# Patient Record
Sex: Male | Born: 1975 | Race: White | Hispanic: No | Marital: Married | Smoking: Never smoker
Health system: Southern US, Community
[De-identification: ages and names within clinical notes are randomized; demographics above are authoritative.]

## PROBLEM LIST (undated history)

## (undated) DIAGNOSIS — G4730 Sleep apnea, unspecified: Secondary | ICD-10-CM

## (undated) DIAGNOSIS — Z87442 Personal history of urinary calculi: Secondary | ICD-10-CM

## (undated) HISTORY — PX: WISDOM TOOTH EXTRACTION: SHX21

## (undated) HISTORY — PX: HAND TENDON SURGERY: SHX663

---

## 2019-01-04 ENCOUNTER — Encounter (HOSPITAL_BASED_OUTPATIENT_CLINIC_OR_DEPARTMENT_OTHER): Payer: Self-pay | Admitting: *Deleted

## 2019-01-04 ENCOUNTER — Other Ambulatory Visit: Payer: Self-pay | Admitting: Otolaryngology

## 2019-01-04 ENCOUNTER — Other Ambulatory Visit: Payer: Self-pay

## 2019-01-06 ENCOUNTER — Other Ambulatory Visit (HOSPITAL_COMMUNITY)
Admission: RE | Admit: 2019-01-06 | Discharge: 2019-01-06 | Disposition: A | Discharge status: Home / Self Care | Payer: Managed Care, Other (non HMO) | Source: Ambulatory Visit | Attending: Otolaryngology | Admitting: Otolaryngology

## 2019-01-06 DIAGNOSIS — Z1159 Encounter for screening for other viral diseases: Secondary | ICD-10-CM | POA: Insufficient documentation

## 2019-01-06 LAB — SARS CORONAVIRUS 2 (TAT 6-24 HRS): SARS Coronavirus 2: NEGATIVE

## 2019-01-10 ENCOUNTER — Other Ambulatory Visit: Payer: Self-pay

## 2019-01-10 ENCOUNTER — Ambulatory Visit (HOSPITAL_BASED_OUTPATIENT_CLINIC_OR_DEPARTMENT_OTHER)
Admission: RE | Admit: 2019-01-10 | Discharge: 2019-01-10 | Disposition: A | Discharge status: Home / Self Care | Payer: Managed Care, Other (non HMO) | Attending: Otolaryngology | Admitting: Otolaryngology

## 2019-01-10 ENCOUNTER — Encounter (HOSPITAL_BASED_OUTPATIENT_CLINIC_OR_DEPARTMENT_OTHER)
Admission: RE | Disposition: A | Discharge status: Home / Self Care | Payer: Self-pay | Source: Home / Self Care | Attending: Otolaryngology

## 2019-01-10 ENCOUNTER — Encounter (HOSPITAL_BASED_OUTPATIENT_CLINIC_OR_DEPARTMENT_OTHER): Payer: Self-pay

## 2019-01-10 ENCOUNTER — Ambulatory Visit (HOSPITAL_BASED_OUTPATIENT_CLINIC_OR_DEPARTMENT_OTHER): Payer: Managed Care, Other (non HMO) | Admitting: Anesthesiology

## 2019-01-10 DIAGNOSIS — G4733 Obstructive sleep apnea (adult) (pediatric): Secondary | ICD-10-CM | POA: Insufficient documentation

## 2019-01-10 HISTORY — PX: DRUG INDUCED ENDOSCOPY: SHX6808

## 2019-01-10 HISTORY — DX: Sleep apnea, unspecified: G47.30

## 2019-01-10 SURGERY — DRUG INDUCED SLEEP ENDOSCOPY
Anesthesia: Monitor Anesthesia Care | Site: Nose

## 2019-01-10 MED ORDER — LACTATED RINGERS IV SOLN
INTRAVENOUS | Status: DC
  Administered 2019-01-10: 08:00:00 via INTRAVENOUS

## 2019-01-10 MED ORDER — MIDAZOLAM HCL 2 MG/2ML IJ SOLN: 1.0000 mg | INTRAMUSCULAR | Status: DC | PRN

## 2019-01-10 MED ORDER — SCOPOLAMINE 1 MG/3DAYS TD PT72: 1.0000 | MEDICATED_PATCH | Freq: Once | TRANSDERMAL | Status: DC

## 2019-01-10 MED ORDER — PROPOFOL 500 MG/50ML IV EMUL
INTRAVENOUS | Status: DC | PRN
  Administered 2019-01-10: 75 ug/kg/min via INTRAVENOUS

## 2019-01-10 MED ORDER — FENTANYL CITRATE (PF) 100 MCG/2ML IJ SOLN: 25.0000 ug | INTRAMUSCULAR | Status: DC | PRN

## 2019-01-10 MED ORDER — MEPERIDINE HCL 25 MG/ML IJ SOLN: 6.2500 mg | INTRAMUSCULAR | Status: DC | PRN

## 2019-01-10 MED ORDER — OXYMETAZOLINE HCL 0.05 % NA SOLN
NASAL | Status: DC | PRN
  Administered 2019-01-10: 1

## 2019-01-10 MED ORDER — LIDOCAINE 2% (20 MG/ML) 5 ML SYRINGE
INTRAMUSCULAR | Status: DC | PRN
  Administered 2019-01-10: 80 mg via INTRAVENOUS

## 2019-01-10 MED ORDER — FENTANYL CITRATE (PF) 100 MCG/2ML IJ SOLN: 50.0000 ug | INTRAMUSCULAR | Status: DC | PRN

## 2019-01-10 MED ORDER — PROMETHAZINE HCL 25 MG/ML IJ SOLN: 6.2500 mg | INTRAMUSCULAR | Status: DC | PRN

## 2019-01-10 MED ORDER — LIDOCAINE 2% (20 MG/ML) 5 ML SYRINGE
INTRAMUSCULAR | Status: AC
  Filled 2019-01-10: qty 5

## 2019-01-10 SURGICAL SUPPLY — 12 items
CANISTER SUCT 1200ML W/VALVE (MISCELLANEOUS) ×2 IMPLANT
COVER WAND RF STERILE (DRAPES) IMPLANT
GLOVE BIO SURGEON STRL SZ7.5 (GLOVE) ×2 IMPLANT
NDL PRECISIONGLIDE 27X1.5 (NEEDLE) IMPLANT
NEEDLE PRECISIONGLIDE 27X1.5 (NEEDLE) IMPLANT
PACK BASIN DAY SURGERY FS (CUSTOM PROCEDURE TRAY) ×2 IMPLANT
PATTIES SURGICAL .5 X3 (DISPOSABLE) ×2 IMPLANT
SHEET MEDIUM DRAPE 40X70 STRL (DRAPES) ×1 IMPLANT
SOLUTION ANTI FOG 6CC (MISCELLANEOUS) ×2 IMPLANT
SYR CONTROL 10ML LL (SYRINGE) IMPLANT
TOWEL GREEN STERILE FF (TOWEL DISPOSABLE) ×2 IMPLANT
TUBE CONNECTING 20X1/4 (TUBING) ×2 IMPLANT

## 2019-01-10 NOTE — H&P (Signed)
Eddie Edwards is an 43 y.o. male.   Chief Complaint: Sleep apnea HPI: 43 year old male with obstructive sleep apnea unable to tolerate CPAP.  He presents for sleep endoscopy.  Past Medical History:  Diagnosis Date  . Sleep apnea     Past Surgical History:  Procedure Laterality Date  . HAND TENDON SURGERY      History reviewed. No pertinent family history. Social History:  reports that he has never smoked. He has never used smokeless tobacco. He reports current alcohol use. He reports that he does not use drugs.  Allergies: No Known Allergies  Medications Prior to Admission  Medication Sig Dispense Refill  . Multiple Vitamins-Minerals (MULTIVITAMIN ADULTS PO) Take by mouth.      No results found for this or any previous visit (from the past 48 hour(s)). No results found.  Review of Systems  All other systems reviewed and are negative.   Blood pressure 115/69, temperature 98.1 F (36.7 C), temperature source Oral, resp. rate 18, height 5' 11"  (1.803 m), weight 84.7 kg, SpO2 100 %. Physical Exam  Constitutional: He is oriented to person, place, and time. He appears well-developed and well-nourished. No distress.  HENT:  Head: Normocephalic and atraumatic.  Right Ear: External ear normal.  Left Ear: External ear normal.  Nose: Nose normal.  Mouth/Throat: Oropharynx is clear and moist.  Eyes: Pupils are equal, round, and reactive to light. Conjunctivae and EOM are normal.  Neck: Normal range of motion. Neck supple.  Cardiovascular: Normal rate.  Respiratory: Effort normal.  Neurological: He is alert and oriented to person, place, and time. No cranial nerve deficit.  Skin: Skin is warm and dry.  Psychiatric: He has a normal mood and affect. His behavior is normal. Judgment and thought content normal.     Assessment/Plan Obstructive sleep apnea  To OR for DISE.  Melida Quitter, MD 01/10/2019, 8:31 AM

## 2019-01-10 NOTE — Transfer of Care (Signed)
Immediate Anesthesia Transfer of Care Note  Patient: Caedon Bond Kaigler  Procedure(s) Performed: DRUG INDUCED ENDOSCOPY (N/A Nose)  Patient Location: PACU  Anesthesia Type:MAC  Level of Consciousness: awake, alert  and oriented  Airway & Oxygen Therapy: Patient Spontanous Breathing and Patient connected to nasal cannula oxygen  Post-op Assessment: Report given to RN and Post -op Vital signs reviewed and stable  Post vital signs: Reviewed and stable  Last Vitals:  Vitals Value Taken Time  BP    Temp    Pulse 56   Resp 14   SpO2 100%     Last Pain:  Vitals:   01/10/19 0815  TempSrc: Oral  PainSc: 0-No pain      Patients Stated Pain Goal: 4 (36/68/15 9470)  Complications: No apparent anesthesia complications

## 2019-01-10 NOTE — Anesthesia Preprocedure Evaluation (Addendum)
Anesthesia Evaluation  Patient identified by MRN, date of birth, ID band Patient awake    Reviewed: Allergy & Precautions, NPO status , Patient's Chart, lab work & pertinent test results  Airway Mallampati: II  TM Distance: >3 FB Neck ROM: Full    Dental  (+) Dental Advisory Given   Pulmonary neg pulmonary ROS,    Pulmonary exam normal breath sounds clear to auscultation       Cardiovascular negative cardio ROS Normal cardiovascular exam Rhythm:Regular Rate:Normal     Neuro/Psych negative neurological ROS  negative psych ROS   GI/Hepatic negative GI ROS, Neg liver ROS,   Endo/Other  negative endocrine ROS  Renal/GU negative Renal ROS     Musculoskeletal negative musculoskeletal ROS (+)   Abdominal   Peds  Hematology negative hematology ROS (+)   Anesthesia Other Findings   Reproductive/Obstetrics                             Anesthesia Physical Anesthesia Plan  ASA: II  Anesthesia Plan: MAC   Post-op Pain Management:    Induction: Intravenous  PONV Risk Score and Plan: 2 and Ondansetron, Dexamethasone and Treatment may vary due to age or medical condition  Airway Management Planned: Simple Face Mask and Natural Airway  Additional Equipment:   Intra-op Plan:   Post-operative Plan:   Informed Consent: I have reviewed the patients History and Physical, chart, labs and discussed the procedure including the risks, benefits and alternatives for the proposed anesthesia with the patient or authorized representative who has indicated his/her understanding and acceptance.     Dental advisory given  Plan Discussed with: CRNA  Anesthesia Plan Comments:        Anesthesia Quick Evaluation

## 2019-01-10 NOTE — Op Note (Signed)
NAME: Eddie Edwards, Eddie Edwards MEDICAL RECORD [id number] ACCOUNT [id number] DATE OF BIRTH:03/31/1976 FACILITY: [facility] LOCATION: [facility] PHYSICIAN:Munira Polson Guido Sander, MD  OPERATIVE REPORT  DATE OF PROCEDURE:  01/10/2019  PREOPERATIVE DIAGNOSIS:  Obstructive sleep apnea.  POSTOPERATIVE DIAGNOSIS:  Obstructive sleep apnea.  PROCEDURE:  Drug-induced sleep endoscopy.  SURGEON:  Melida Quitter, MD  ANESTHESIA:  IV sedation.  COMPLICATIONS:  None.  INDICATIONS:  The patient is a 43 year old male with obstructive sleep apnea diagnosed in 2018.  At that time, his AHI was 40.  He has lost weight since then, but continues to have symptoms of obstructive sleep apnea.  He has been using CPAP, but only  able to use it 4-5 hours per night due to the device tethering and disrupting sleep.  He presents to the operating room for drug drug-induced sleep endoscopy.  FINDINGS:  At the velum pharynx, his airway collapsed the anterior-posterior about 75% with some mild sidewall collapse.  This makes a good candidate for Inspire.  Further evaluation of his airway demonstrated no other obstructing lesions.  His tongue  base was in a moderately posterior position during sleep.  DESCRIPTION OF PROCEDURE:  The patient was identified in the holding room, informed consent having been obtained including discussion of risks, benefits and alternatives.  The patient was brought to the operative suite and put the operative table in  supine position.  Anesthesia was induced and the patient was given IV sedation gradually until he achieved a simulated sleep.  An Afrin pledget was placed in the right side of the nose for a couple minutes and then removed.  The flexible scope was then  passed through the right nasal passage to view the nasopharynx, oropharynx, and hypopharynx.  Please see the findings.  The exam was recorded.  After this was completed, the scope was removed and the patient was returned to Anesthesia for wakeup.     He was moved to recovery room in stable condition.  AN/NUANCE  D:01/10/2019 T:01/10/2019 JOB:[id number]

## 2019-01-10 NOTE — Brief Op Note (Signed)
01/10/2019  9:26 AM  PATIENT:  Eddie Edwards  43 y.o. male  PRE-OPERATIVE DIAGNOSIS:  obstructive sleep apnea  POST-OPERATIVE DIAGNOSIS:  obstructive sleep apnea  PROCEDURE:  Procedure(s): DRUG INDUCED ENDOSCOPY (N/A)  SURGEON:  Surgeon(s) and Role:    Melida Quitter, MD - Primary  PHYSICIAN ASSISTANT:   ASSISTANTS: none   ANESTHESIA:   IV sedation  EBL: None  BLOOD ADMINISTERED:none  DRAINS: none   LOCAL MEDICATIONS USED:  NONE  SPECIMEN:  No Specimen  DISPOSITION OF SPECIMEN:  N/A  COUNTS:  YES  TOURNIQUET:  * No tourniquets in log *  DICTATION: .Other Dictation: Dictation Number [id number]  PLAN OF CARE: Discharge to home after PACU  PATIENT DISPOSITION:  PACU - hemodynamically stable.   Delay start of Pharmacological VTE agent (>24hrs) due to surgical blood loss or risk of bleeding: no

## 2019-01-10 NOTE — Anesthesia Postprocedure Evaluation (Signed)
Anesthesia Post Note  Patient: Eddie Edwards  Procedure(s) Performed: DRUG INDUCED ENDOSCOPY (N/A Nose)     Patient location during evaluation: PACU Anesthesia Type: MAC Level of consciousness: awake and alert Pain management: pain level controlled Vital Signs Assessment: post-procedure vital signs reviewed and stable Respiratory status: spontaneous breathing Cardiovascular status: stable Anesthetic complications: no    Last Vitals:  Vitals:   01/10/19 0945 01/10/19 1000  BP: 120/80 120/78  Pulse: (!) 44 (!) 46  Resp: 15 18  Temp:  36.7 C  SpO2: 99% 100%    Last Pain:  Vitals:   01/10/19 1000  TempSrc:   PainSc: 0-No pain                 Nolon Nations

## 2019-01-11 ENCOUNTER — Encounter (HOSPITAL_BASED_OUTPATIENT_CLINIC_OR_DEPARTMENT_OTHER): Payer: Self-pay | Admitting: Otolaryngology

## 2019-03-02 ENCOUNTER — Other Ambulatory Visit: Payer: Self-pay | Admitting: Otolaryngology

## 2019-03-03 ENCOUNTER — Other Ambulatory Visit: Payer: Self-pay

## 2019-03-03 ENCOUNTER — Other Ambulatory Visit: Payer: Self-pay | Admitting: Internal Medicine

## 2019-03-03 ENCOUNTER — Other Ambulatory Visit (HOSPITAL_COMMUNITY)
Admission: RE | Admit: 2019-03-03 | Discharge: 2019-03-03 | Disposition: A | Discharge status: Home / Self Care | Payer: Managed Care, Other (non HMO) | Source: Ambulatory Visit | Attending: Otolaryngology | Admitting: Otolaryngology

## 2019-03-03 DIAGNOSIS — Z01812 Encounter for preprocedural laboratory examination: Secondary | ICD-10-CM | POA: Insufficient documentation

## 2019-03-03 DIAGNOSIS — Z20828 Contact with and (suspected) exposure to other viral communicable diseases: Secondary | ICD-10-CM | POA: Insufficient documentation

## 2019-03-03 LAB — SARS CORONAVIRUS 2 BY RT PCR (HOSPITAL ORDER, PERFORMED IN ~~LOC~~ HOSPITAL LAB): SARS Coronavirus 2: NEGATIVE

## 2019-03-04 ENCOUNTER — Other Ambulatory Visit: Payer: Self-pay

## 2019-03-04 ENCOUNTER — Encounter (HOSPITAL_COMMUNITY): Payer: Self-pay | Admitting: *Deleted

## 2019-03-04 ENCOUNTER — Other Ambulatory Visit (HOSPITAL_COMMUNITY): Admission: RE | Admit: 2019-03-04 | Payer: Managed Care, Other (non HMO) | Source: Ambulatory Visit

## 2019-03-04 NOTE — Progress Notes (Signed)
Spoke with pt for pre-op call. Pt denies cardiac history, HTN or Diabetes.   Pt had Covid test done yesterday and it is negative. Pt states he's been quarantine since test was done.   Coronavirus Screening  Have you experienced the following symptoms:  Cough NO Fever (>100.67F)  NO Runny nose NO Sore throat NO Difficulty breathing/shortness of breath  NO  Have you or a family member traveled in the last 14 days and where?   Patient reminded that hospital visitation restrictions are in effect and the importance of the restrictions. Informed pt that he may have one visitor sit in the waiting area while he is in pre-op, surgery and PACU. Pt voiced understanding.

## 2019-03-07 ENCOUNTER — Ambulatory Visit (HOSPITAL_COMMUNITY): Payer: Managed Care, Other (non HMO) | Admitting: Anesthesiology

## 2019-03-07 ENCOUNTER — Other Ambulatory Visit: Payer: Self-pay

## 2019-03-07 ENCOUNTER — Ambulatory Visit (HOSPITAL_COMMUNITY): Payer: Managed Care, Other (non HMO)

## 2019-03-07 ENCOUNTER — Encounter (HOSPITAL_COMMUNITY)
Admission: RE | Disposition: A | Discharge status: Home / Self Care | Payer: Self-pay | Source: Intra-hospital | Attending: Otolaryngology

## 2019-03-07 ENCOUNTER — Encounter (HOSPITAL_COMMUNITY): Payer: Self-pay

## 2019-03-07 ENCOUNTER — Ambulatory Visit (HOSPITAL_COMMUNITY)
Admission: RE | Admit: 2019-03-07 | Discharge: 2019-03-07 | Disposition: A | Discharge status: Home / Self Care | Payer: Managed Care, Other (non HMO) | Source: Intra-hospital | Attending: Otolaryngology | Admitting: Otolaryngology

## 2019-03-07 DIAGNOSIS — G4733 Obstructive sleep apnea (adult) (pediatric): Secondary | ICD-10-CM | POA: Diagnosis not present

## 2019-03-07 HISTORY — DX: Personal history of urinary calculi: Z87.442

## 2019-03-07 HISTORY — PX: IMPLANTATION OF HYPOGLOSSAL NERVE STIMULATOR: SHX6827

## 2019-03-07 LAB — HEMOGLOBIN: Hemoglobin: 15.3 g/dL (ref 13.0–17.0)

## 2019-03-07 SURGERY — INSERTION, HYPOGLOSSAL NERVE STIMULATOR
Anesthesia: General | Site: Chest

## 2019-03-07 MED ORDER — 0.9 % SODIUM CHLORIDE (POUR BTL) OPTIME
TOPICAL | Status: DC | PRN
  Administered 2019-03-07: 1000 mL

## 2019-03-07 MED ORDER — LIDOCAINE 2% (20 MG/ML) 5 ML SYRINGE
INTRAMUSCULAR | Status: DC | PRN
  Administered 2019-03-07: 60 mg via INTRAVENOUS

## 2019-03-07 MED ORDER — PROPOFOL 10 MG/ML IV BOLUS
INTRAVENOUS | Status: AC
  Filled 2019-03-07: qty 20

## 2019-03-07 MED ORDER — HYDROCODONE-ACETAMINOPHEN 5-325 MG PO TABS
ORAL_TABLET | ORAL | Status: AC
  Filled 2019-03-07: qty 1

## 2019-03-07 MED ORDER — ONDANSETRON HCL 4 MG/2ML IJ SOLN: 4.0000 mg | INTRAMUSCULAR | Status: DC | PRN

## 2019-03-07 MED ORDER — PROPOFOL 1000 MG/100ML IV EMUL
INTRAVENOUS | Status: AC
  Filled 2019-03-07: qty 100

## 2019-03-07 MED ORDER — STERILE WATER FOR IRRIGATION IR SOLN
Status: DC | PRN
  Administered 2019-03-07: 1000 mL

## 2019-03-07 MED ORDER — FENTANYL CITRATE (PF) 250 MCG/5ML IJ SOLN
INTRAMUSCULAR | Status: AC
  Filled 2019-03-07: qty 5

## 2019-03-07 MED ORDER — ACETAMINOPHEN 500 MG PO TABS
1000.0000 mg | ORAL_TABLET | Freq: Once | ORAL | Status: AC
  Administered 2019-03-07: 09:00:00 1000 mg via ORAL

## 2019-03-07 MED ORDER — KCL IN DEXTROSE-NACL 20-5-0.45 MEQ/L-%-% IV SOLN
INTRAVENOUS | Status: DC
  Administered 2019-03-07: 15:00:00 via INTRAVENOUS
  Filled 2019-03-07: qty 1000

## 2019-03-07 MED ORDER — PROPOFOL 500 MG/50ML IV EMUL
INTRAVENOUS | Status: DC | PRN
  Administered 2019-03-07: 40 ug/kg/min via INTRAVENOUS

## 2019-03-07 MED ORDER — MIDAZOLAM HCL 2 MG/2ML IJ SOLN
INTRAMUSCULAR | Status: AC
  Filled 2019-03-07: qty 2

## 2019-03-07 MED ORDER — ONDANSETRON HCL 4 MG/2ML IJ SOLN
INTRAMUSCULAR | Status: DC | PRN
  Administered 2019-03-07: 4 mg via INTRAVENOUS

## 2019-03-07 MED ORDER — SODIUM CHLORIDE 0.9 % IV SOLN
INTRAVENOUS | Status: DC | PRN
  Administered 2019-03-07: 500 mL

## 2019-03-07 MED ORDER — HYDROCODONE-ACETAMINOPHEN 5-325 MG PO TABS
1.0000 | ORAL_TABLET | ORAL | Status: DC | PRN
  Administered 2019-03-07 (×2): 2 via ORAL
  Filled 2019-03-07: qty 2

## 2019-03-07 MED ORDER — LIDOCAINE-EPINEPHRINE 1 %-1:100000 IJ SOLN
INTRAMUSCULAR | Status: AC
  Filled 2019-03-07: qty 1

## 2019-03-07 MED ORDER — FENTANYL CITRATE (PF) 250 MCG/5ML IJ SOLN
INTRAMUSCULAR | Status: DC | PRN
  Administered 2019-03-07 (×5): 50 ug via INTRAVENOUS

## 2019-03-07 MED ORDER — MORPHINE SULFATE (PF) 2 MG/ML IV SOLN: 2.0000 mg | INTRAVENOUS | Status: DC | PRN

## 2019-03-07 MED ORDER — ONDANSETRON HCL 4 MG PO TABS: 4.0000 mg | ORAL_TABLET | ORAL | Status: DC | PRN

## 2019-03-07 MED ORDER — DEXAMETHASONE SODIUM PHOSPHATE 10 MG/ML IJ SOLN
INTRAMUSCULAR | Status: DC | PRN
  Administered 2019-03-07: 10 mg via INTRAVENOUS

## 2019-03-07 MED ORDER — PROPOFOL 10 MG/ML IV BOLUS
INTRAVENOUS | Status: DC | PRN
  Administered 2019-03-07: 200 mg via INTRAVENOUS

## 2019-03-07 MED ORDER — LIDOCAINE-EPINEPHRINE 1 %-1:100000 IJ SOLN
INTRAMUSCULAR | Status: DC | PRN
  Administered 2019-03-07: 8.5 mL

## 2019-03-07 MED ORDER — SUCCINYLCHOLINE CHLORIDE 200 MG/10ML IV SOSY
PREFILLED_SYRINGE | INTRAVENOUS | Status: DC | PRN
  Administered 2019-03-07: 120 mg via INTRAVENOUS

## 2019-03-07 MED ORDER — MIDAZOLAM HCL 5 MG/5ML IJ SOLN
INTRAMUSCULAR | Status: DC | PRN
  Administered 2019-03-07: 2 mg via INTRAVENOUS

## 2019-03-07 MED ORDER — CEFAZOLIN SODIUM-DEXTROSE 2-4 GM/100ML-% IV SOLN
2.0000 g | INTRAVENOUS | Status: AC
  Administered 2019-03-07: 09:00:00 2 g via INTRAVENOUS
  Filled 2019-03-07: qty 100

## 2019-03-07 MED ORDER — SODIUM CHLORIDE 0.9 % IV SOLN
INTRAVENOUS | Status: AC
  Filled 2019-03-07: qty 500000

## 2019-03-07 MED ORDER — ACETAMINOPHEN 500 MG PO TABS
ORAL_TABLET | ORAL | Status: AC
  Filled 2019-03-07: qty 2

## 2019-03-07 MED ORDER — LACTATED RINGERS IV SOLN
INTRAVENOUS | Status: DC
  Administered 2019-03-07 (×2): via INTRAVENOUS

## 2019-03-07 SURGICAL SUPPLY — 68 items
BAG DECANTER FOR FLEXI CONT (MISCELLANEOUS) ×2 IMPLANT
BLADE CLIPPER SURG (BLADE) ×1 IMPLANT
BLADE SURG 15 STRL LF DISP TIS (BLADE) ×3 IMPLANT
BLADE SURG 15 STRL SS (BLADE) ×2
CANISTER SUCT 3000ML PPV (MISCELLANEOUS) ×2 IMPLANT
CORD BIPOLAR FORCEPS 12FT (ELECTRODE) ×2 IMPLANT
COVER PROBE W GEL 5X96 (DRAPES) ×2 IMPLANT
COVER SURGICAL LIGHT HANDLE (MISCELLANEOUS) ×2 IMPLANT
COVER WAND RF STERILE (DRAPES) ×2 IMPLANT
DERMABOND ADVANCED (GAUZE/BANDAGES/DRESSINGS) ×3
DERMABOND ADVANCED .7 DNX12 (GAUZE/BANDAGES/DRESSINGS) ×2 IMPLANT
DRAPE C-ARM 35X43 STRL (DRAPES) ×1 IMPLANT
DRAPE HEAD BAR (DRAPES) ×2 IMPLANT
DRAPE INCISE IOBAN 66X45 STRL (DRAPES) ×2 IMPLANT
DRAPE MICROSCOPE LEICA 54X105 (DRAPE) ×1 IMPLANT
DRAPE UTILITY XL STRL (DRAPES) ×1 IMPLANT
DRSG TEGADERM 2-3/8X2-3/4 SM (GAUZE/BANDAGES/DRESSINGS) ×5 IMPLANT
ELECT COATED BLADE 2.86 ST (ELECTRODE) ×2 IMPLANT
ELECT EMG 18 NIMS (NEUROSURGERY SUPPLIES) ×2
ELECT REM PT RETURN 9FT ADLT (ELECTROSURGICAL) ×2
ELECTRODE EMG 18 NIMS (NEUROSURGERY SUPPLIES) ×1 IMPLANT
ELECTRODE REM PT RTRN 9FT ADLT (ELECTROSURGICAL) ×1 IMPLANT
FORCEPS BIPOLAR SPETZLER 8 1.0 (NEUROSURGERY SUPPLIES) ×2 IMPLANT
GAUZE 4X4 16PLY RFD (DISPOSABLE) ×2 IMPLANT
GAUZE SPONGE 4X4 12PLY STRL (GAUZE/BANDAGES/DRESSINGS) ×4 IMPLANT
GENERATOR PULSE INSPIRE (Generator) ×2 IMPLANT
GENERATOR PULSE INSPIRE IV (Generator) ×1 IMPLANT
GLOVE BIO SURGEON STRL SZ 6.5 (GLOVE) IMPLANT
GLOVE BIO SURGEON STRL SZ7.5 (GLOVE) ×2 IMPLANT
GOWN STRL REUS W/ TWL LRG LVL3 (GOWN DISPOSABLE) ×3 IMPLANT
GOWN STRL REUS W/TWL LRG LVL3 (GOWN DISPOSABLE) ×3
IV CATH 18GX1.25 SAFE RETR GRN (IV SOLUTION) ×1 IMPLANT
KIT BASIN OR (CUSTOM PROCEDURE TRAY) ×2 IMPLANT
KIT NEUROSTIMULATOR ACCESSORY (KITS) IMPLANT
KIT TURNOVER KIT B (KITS) ×2 IMPLANT
LEAD SENSING RESP INSPIRE (Lead) ×2 IMPLANT
LEAD SENSING RESP INSPIRE IV (Lead) ×1 IMPLANT
LEAD SLEEP STIM INSPIRE IV/V (Lead) ×1 IMPLANT
LEAD SLEEP STIMULATION INSPIRE (Lead) ×2 IMPLANT
LOOP VESSEL MAXI BLUE (MISCELLANEOUS) ×2 IMPLANT
LOOP VESSEL MINI RED (MISCELLANEOUS) ×1 IMPLANT
MARKER SKIN DUAL TIP RULER LAB (MISCELLANEOUS) ×4 IMPLANT
NDL HYPO 25GX1X1/2 BEV (NEEDLE) ×1 IMPLANT
NEEDLE HYPO 25GX1X1/2 BEV (NEEDLE) ×2 IMPLANT
NS IRRIG 1000ML POUR BTL (IV SOLUTION) ×2 IMPLANT
PAD ARMBOARD 7.5X6 YLW CONV (MISCELLANEOUS) ×2 IMPLANT
PASSER CATH 38CM DISP (INSTRUMENTS) ×2 IMPLANT
PENCIL BUTTON HOLSTER BLD 10FT (ELECTRODE) ×2 IMPLANT
POSITIONER HEAD DONUT 9IN (MISCELLANEOUS) ×2 IMPLANT
PROBE NERVE STIMULATOR (NEUROSURGERY SUPPLIES) ×2 IMPLANT
REMOTE CONTROL SLEEP INSPIRE (MISCELLANEOUS) ×2 IMPLANT
SET WALTER ACTIVATION W/DRAPE (SET/KITS/TRAYS/PACK) ×1 IMPLANT
SLING ARM FOAM STRAP LRG (SOFTGOODS) ×1 IMPLANT
SPONGE INTESTINAL PEANUT (DISPOSABLE) ×1 IMPLANT
STAPLER VISISTAT 35W (STAPLE) ×2 IMPLANT
SUT SILK 2 0 SH (SUTURE) ×2 IMPLANT
SUT SILK 3 0 REEL (SUTURE) ×2 IMPLANT
SUT SILK 3 0 SH 30 (SUTURE) ×5 IMPLANT
SUT SILK 3-0 (SUTURE) ×1
SUT SILK 3-0 RB1 30XBRD (SUTURE) ×1
SUT VIC AB 3-0 SH 27 (SUTURE) ×3
SUT VIC AB 3-0 SH 27X BRD (SUTURE) ×1 IMPLANT
SUT VIC AB 4-0 PS2 27 (SUTURE) ×6 IMPLANT
SUTURE SILK 3-0 RB1 30XBRD (SUTURE) ×1 IMPLANT
SYR 10ML LL (SYRINGE) ×2 IMPLANT
TAPE CLOTH SURG 4X10 WHT LF (GAUZE/BANDAGES/DRESSINGS) ×4 IMPLANT
TOWEL GREEN STERILE (TOWEL DISPOSABLE) ×2 IMPLANT
TRAY ENT MC OR (CUSTOM PROCEDURE TRAY) ×2 IMPLANT

## 2019-03-07 NOTE — Brief Op Note (Signed)
03/07/2019  11:40 AM  PATIENT:  Eddie Edwards  43 y.o. male  PRE-OPERATIVE DIAGNOSIS:  obstructive sleep apnea  POST-OPERATIVE DIAGNOSIS:  obstructive sleep apnea  PROCEDURE:  Procedure(s) with comments: IMPLANTATION OF HYPOGLOSSAL NERVE STIMULATOR (N/A) - right neck, right anterior upper chest, right lower lateral chest/upperabdomen  SURGEON:  Surgeon(s) and Role:    Melida Quitter, MD - Primary  PHYSICIAN ASSISTANT:   ASSISTANTS: none   ANESTHESIA:   general  EBL:  50 mL   BLOOD ADMINISTERED:none  DRAINS: none   LOCAL MEDICATIONS USED:  LIDOCAINE   SPECIMEN:  No Specimen  DISPOSITION OF SPECIMEN:  N/A  COUNTS:  YES  TOURNIQUET:  * No tourniquets in log *  DICTATION: .Other Dictation: Dictation Number [id number]  PLAN OF CARE: Admit for overnight observation  PATIENT DISPOSITION:  PACU - hemodynamically stable.   Delay start of Pharmacological VTE agent (>24hrs) due to surgical blood loss or risk of bleeding: no

## 2019-03-07 NOTE — Transfer of Care (Signed)
Immediate Anesthesia Transfer of Care Note  Patient: Eddie Edwards  Procedure(s) Performed: IMPLANTATION OF HYPOGLOSSAL NERVE STIMULATOR (N/A Chest)  Patient Location: PACU  Anesthesia Type:General  Level of Consciousness: awake, alert  and oriented  Airway & Oxygen Therapy: Patient Spontanous Breathing and Patient connected to nasal cannula oxygen  Post-op Assessment: Report given to RN and Post -op Vital signs reviewed and stable  Post vital signs: Reviewed and stable  Last Vitals:  Vitals Value Taken Time  BP 130/74 03/07/19 1158  Temp    Pulse 57 03/07/19 1201  Resp 16 03/07/19 1201  SpO2 100 % 03/07/19 1201  Vitals shown include unvalidated device data.  Last Pain:  Vitals:   03/07/19 0742  TempSrc:   PainSc: 0-No pain         Complications: No apparent anesthesia complications

## 2019-03-07 NOTE — Discharge Summary (Signed)
Physician Discharge Summary  Patient ID: Eddie Edwards MRN: [id number] DOB/AGE: 01/01/1976 43 y.o.  Admit date: 03/07/2019 Discharge date: 03/07/2019  Admission Diagnoses: Obstructive sleep apnea  Discharge Diagnoses:  Active Problems:   OSA (obstructive sleep apnea)   Discharged Condition: good  Hospital Course: 43 year old male with obstructive sleep apnea not tolerating CPAP therapy presented for placement of the hypoglossal nerve stimulator.  See operative note.  He was observed for a few hours after surgery and did well.  He is felt stable for discharge.  Consults: None  Significant Diagnostic Studies: None  Treatments: Hypoglossal nerve stimulator placement  Discharge Exam: Blood pressure 139/86, pulse 64, temperature 97.9 F (36.6 C), temperature source Oral, resp. rate 18, height 5' 11"  (1.803 m), weight 81.6 kg, SpO2 96 %. General appearance: alert, cooperative and no distress Neck: right neck incision clean and intact Chest wall: both right chest incisions clean and intact  Disposition: Discharge disposition: 01-Home or Self Care       Discharge Instructions    Diet - low sodium heart healthy   Complete by: As directed    Discharge instructions   Complete by: As directed    Keep right arm in a sling until tomorrow, then wear for comfort as needed.  Avoid any right arm swinging or heavy lifting for one month.  OK to allow incisions to get wet, gently pat dry.  Do not apply ointment to incisions.   Increase activity slowly   Complete by: As directed      Allergies as of 03/07/2019   No Known Allergies     Medication List    TAKE these medications   acetaminophen 500 MG tablet Commonly known as: TYLENOL Take 1,000 mg by mouth every 6 (six) hours as needed for moderate pain or headache.   FISH OIL PO Take 1 capsule by mouth 4 (four) times a week.   MULTIVITAMIN ADULTS PO Take 1 tablet by mouth daily.      Follow-up Information    Melida Quitter, MD. Schedule an appointment as soon as possible for a visit in 1 week.   Specialty: Otolaryngology Contact information: [address] [place] [address] [phone]           Signed: Melida Quitter 03/07/2019, 6:03 PM

## 2019-03-07 NOTE — Anesthesia Postprocedure Evaluation (Signed)
Anesthesia Post Note  Patient: Eddie Edwards  Procedure(s) Performed: IMPLANTATION OF HYPOGLOSSAL NERVE STIMULATOR (N/A Chest)     Patient location during evaluation: PACU Anesthesia Type: General Level of consciousness: awake and alert Pain management: pain level controlled Vital Signs Assessment: post-procedure vital signs reviewed and stable Respiratory status: spontaneous breathing, nonlabored ventilation and respiratory function stable Cardiovascular status: blood pressure returned to baseline and stable Postop Assessment: no apparent nausea or vomiting Anesthetic complications: no    Last Vitals:  Vitals:   03/07/19 1200 03/07/19 1213  BP: 130/74 133/81  Pulse: 68 (!) 57  Resp: 12 13  Temp: 36.7 C   SpO2: 100% 100%    Last Pain:  Vitals:   03/07/19 1200  TempSrc:   PainSc: 0-No pain                 Jancarlos Thrun,W. EDMOND

## 2019-03-07 NOTE — Op Note (Signed)
NAME: Eddie Edwards, Eddie Edwards MEDICAL RECORD [id number] ACCOUNT [id number] DATE OF BIRTH:September 18, 1975 FACILITY: [facility] LOCATION: [facility] PHYSICIAN:Cieara Stierwalt Guido Sander, MD  OPERATIVE REPORT  DATE OF PROCEDURE:  03/07/2019  PREOPERATIVE DIAGNOSIS:  Obstructive sleep apnea.  POSTOPERATIVE DIAGNOSIS:  Obstructive sleep apnea.  PROCEDURE:  Placement of hypoglossal nerve stimulator.  SURGEON:  Melida Quitter, MD  ANESTHESIA:  General endotracheal anesthesia.  COMPLICATIONS:  None.  INDICATIONS:  The patient is a 43 year old male with obstructive sleep apnea who has been unable to tolerate CPAP.  He presents for placement of hypoglossal nerve stimulator.  FINDINGS:  Surgical anatomy was normal.  The device was tested in the operating room and had excellent response.  DESCRIPTION OF PROCEDURE:  The patient was identified in the holding room, informed consent having been obtained including discussion of risks, benefits and alternatives.  The patient was brought to the operative suite and put on the table in supine  position.  Anesthesia was induced and the patient was intubated by the anesthesia team without difficulty.  The patient was given intravenous antibiotics during the case.  The bed was turned 180 degrees from anesthesia, and a shoulder roll was placed.   Some of the hair of his beard and chest were shaved.  The incisions were marked on the right neck, right upper chest, and right lateral chest using a marking pen and measuring out the dimensions.  Each incision was injected with 1% lidocaine with  1:100,000 epinephrine.  The electrodes for the nerve integrity monitor were placed in the right tongue and floor of mouth in the standard fashion and turned on during the case.  The right neck and chest were prepped and draped in sterile fashion and  covered with Ioban.  The neck incision was made with a 15-blade scalpel and extended through the subcutaneous and platysmal layers using Bovie  electrocautery.  Dissection was performed directly down onto the lower anterior edge of the submandibular gland  and then onto the digastric tendon.  The tendon was retracted inferiorly with vessel loops.  The gland was retracted posteriorly and superiorly.  The edge of the mylohyoid muscle was dissected, and the mylohyoid muscle was then retracted anteriorly.   This exposed the hypoglossal nerve.  The nerve was then cleared of fascial covering, exposing the C1 branch, as well as the more distal branches.  The stimulator for the nerve integrity monitor was then used to identify branches that were to be included  and excluded from the cuff.  The points between these branches were then identified and dissection performed under the nerve to create a pocket around the inclusion branches.  This was further dissected and confirmed under the operating microscope.  The  stimulator cuff for the implant was then brought into the field and placed around the inclusion branches using right-angle clamp.  Saline was then injected around the nerve inside the cuff using an Angiocath.  The anchor for the cuff was then sutured to  the digastric tendon in 2 places using 3-0 silk suture.  The entire cuff and lead were then placed in the neck, and damp gauze was used to cover them.  At this point, the right upper chest incision was made with a 15-blade scalpel and extended through  the subcutaneous tissues using Bovie electrocautery down to the pectoral fascia.  A pocket was then created over the pectoral fascia inferiorly.  The stay sutures were then placed at the superior extent of the pocket using 2-0 silk suture in 2 positions.  Next, the right lateral chest wall incision was made with a 15-blade scalpel and extended through the subcutaneous tissues using Bovie electrocautery.  Dissection was then performed down to the serratus muscle, which was bluntly dissected, exposing the  external intercostal muscle in the selected  intercostal space.  This was then divided as well bluntly, exposing the internal intercostal muscle.  A malleable was then slid between the external and internal intercostal muscles creating a space for the  sense lead.  The lead was then advanced under the malleable and the malleable removed.  The sense lead was then properly positioned and secured on its anchor using 3-0 silk sutures.  The second anchor was then secured with two 3-0 silk sutures with a  little slack between anchors.  The tunneling device was then passed from the upper chest pocket into the lateral chest wound and used then to pull the sense lead into the generator pocket.  The neck wound was then exposed again and blunt dissection first  performed deep to the platysmal muscle heading toward the clavicle, and then the tunneler was passed through this space over the clavicle and into the generator pocket.  It was then used to pull the stimulator lead into the generator pocket.  At this  point, the generator was opened and each lead was cleaned off and properly placed into the generator holes and tightened into place with 2 clicks on the screwdriver.  The generator was then placed into its pocket, and the device was then tested.  Testing  demonstrated excellent stimulation and sense lead performance.  At this point, the wounds were copiously irrigated with bacitracin saline.  In the neck, the platysmal layer was closed with 3-0 Vicryl suture in a simple interrupted fashion, and the  subcutaneous layer was closed with 4-0 Vicryl suture in a simple interrupted fashion.  The skin was closed with Dermabond.  Both chest incisions were likewise closed with 3-0 Vicryl suture in a simple interrupted fashion in the subcutaneous tissues and  then 4-0 Vicryl suture in a simple interrupted fashion in the subcutaneous skin layer.  Dermabond was added to these wounds as well.  Drapes were removed and the patient was cleaned off.  Pressure dressings were  applied to each of the 3 incisions, and  the nerve integrity monitor was removed.  He was then turned back to anesthesia for wakeup and was extubated in the recovery room in stable condition.  LN/NUANCE  D:03/07/2019 T:03/07/2019 JOB:[id number]

## 2019-03-07 NOTE — H&P (Signed)
Eddie Edwards is an 43 y.o. male.   Chief Complaint: Sleep apnea HPI: 43 year old male with obstructive sleep apnea who has had difficulty tolerating CPAP.  He presents for hypoglossal nerve stimulator placement.  Past Medical History:  Diagnosis Date  . History of kidney stones   . Sleep apnea    uses cpap    Past Surgical History:  Procedure Laterality Date  . DRUG INDUCED ENDOSCOPY N/A 01/10/2019   Procedure: DRUG INDUCED ENDOSCOPY;  Surgeon: Melida Quitter, MD;  Location: [facility];  Service: ENT;  Laterality: N/A;  . HAND TENDON SURGERY    . WISDOM TOOTH EXTRACTION      History reviewed. No pertinent family history. Social History:  reports that he has never smoked. He has never used smokeless tobacco. He reports current alcohol use. He reports that he does not use drugs.  Allergies: No Known Allergies  Medications Prior to Admission  Medication Sig Dispense Refill  . acetaminophen (TYLENOL) 500 MG tablet Take 1,000 mg by mouth every 6 (six) hours as needed for moderate pain or headache.    . Multiple Vitamins-Minerals (MULTIVITAMIN ADULTS PO) Take 1 tablet by mouth daily.     . Omega-3 Fatty Acids (FISH OIL PO) Take 1 capsule by mouth 4 (four) times a week.      No results found for this or any previous visit (from the past 48 hour(s)). No results found.  Review of Systems  All other systems reviewed and are negative.   Blood pressure 138/79, pulse (!) 50, temperature 98.7 F (37.1 C), temperature source Oral, resp. rate 18, height 5' 11"  (1.803 m), weight 81.6 kg, SpO2 100 %. Physical Exam  Constitutional: He is oriented to person, place, and time. He appears well-developed and well-nourished. No distress.  HENT:  Head: Normocephalic and atraumatic.  Right Ear: External ear normal.  Left Ear: External ear normal.  Nose: Nose normal.  Mouth/Throat: Oropharynx is clear and moist.  Eyes: Pupils are equal, round, and reactive to light.  Conjunctivae and EOM are normal.  Neck: Normal range of motion. Neck supple.  Cardiovascular: Normal rate.  Respiratory: Effort normal.  Neurological: He is alert and oriented to person, place, and time. No cranial nerve deficit.  Skin: Skin is warm and dry.  Psychiatric: He has a normal mood and affect. His behavior is normal. Judgment and thought content normal.     Assessment/Plan OSA  To OR for Inspire implant placement.  Melida Quitter, MD 03/07/2019, 7:29 AM

## 2019-03-07 NOTE — Anesthesia Procedure Notes (Signed)
Procedure Name: Intubation Date/Time: 03/07/2019 9:14 AM Performed by: Marsa Aris, CRNA Pre-anesthesia Checklist: Patient identified, Emergency Drugs available, Suction available and Patient being monitored Patient Re-evaluated:Patient Re-evaluated prior to induction Oxygen Delivery Method: Circle System Utilized Preoxygenation: Pre-oxygenation with 100% oxygen Induction Type: IV induction Ventilation: Mask ventilation without difficulty Laryngoscope Size: Miller and 2 Grade View: Grade I Tube type: Oral Number of attempts: 1 Airway Equipment and Method: Stylet and Oral airway Placement Confirmation: ETT inserted through vocal cords under direct vision,  positive ETCO2 and breath sounds checked- equal and bilateral Secured at: 22 cm Tube secured with: Tape Dental Injury: Teeth and Oropharynx as per pre-operative assessment  Comments: No change in dentition from pre-procedure

## 2019-03-07 NOTE — Anesthesia Preprocedure Evaluation (Addendum)
Anesthesia Evaluation  Patient identified by MRN, date of birth, ID band Patient awake    Reviewed: Allergy & Precautions, H&P , NPO status , Patient's Chart, lab work & pertinent test results  Airway Mallampati: II  TM Distance: >3 FB Neck ROM: Full    Dental no notable dental hx. (+) Teeth Intact, Dental Advisory Given   Pulmonary sleep apnea and Continuous Positive Airway Pressure Ventilation ,    Pulmonary exam normal breath sounds clear to auscultation       Cardiovascular negative cardio ROS   Rhythm:Regular Rate:Normal     Neuro/Psych negative neurological ROS  negative psych ROS   GI/Hepatic negative GI ROS, Neg liver ROS,   Endo/Other  negative endocrine ROS  Renal/GU negative Renal ROS  negative genitourinary   Musculoskeletal   Abdominal   Peds  Hematology negative hematology ROS (+)   Anesthesia Other Findings   Reproductive/Obstetrics negative OB ROS                            Anesthesia Physical Anesthesia Plan  ASA: III  Anesthesia Plan: General   Post-op Pain Management:    Induction: Intravenous  PONV Risk Score and Plan: 3 and Ondansetron, Dexamethasone and Midazolam  Airway Management Planned: Oral ETT  Additional Equipment:   Intra-op Plan:   Post-operative Plan: Extubation in OR  Informed Consent: I have reviewed the patients History and Physical, chart, labs and discussed the procedure including the risks, benefits and alternatives for the proposed anesthesia with the patient or authorized representative who has indicated his/her understanding and acceptance.     Dental advisory given  Plan Discussed with: CRNA  Anesthesia Plan Comments:         Anesthesia Quick Evaluation

## 2019-03-10 ENCOUNTER — Encounter (HOSPITAL_COMMUNITY): Payer: Self-pay | Admitting: Otolaryngology

## 2019-03-23 ENCOUNTER — Telehealth: Payer: Self-pay | Admitting: Neurology

## 2019-03-23 DIAGNOSIS — G4719 Other hypersomnia: Secondary | ICD-10-CM | POA: Insufficient documentation

## 2019-03-23 DIAGNOSIS — G4733 Obstructive sleep apnea (adult) (pediatric): Secondary | ICD-10-CM

## 2019-03-23 DIAGNOSIS — Z789 Other specified health status: Secondary | ICD-10-CM | POA: Insufficient documentation

## 2019-03-23 NOTE — Progress Notes (Signed)
@[facility]   Provider:  [name], MD  Primary Care Physician:  Jenkins Rouge, [place] [address]     Referring Provider:  Dr Redmond Baseman, ENT [facility]       Chief Complaint  Patient presents with  . New Patient (Initial Visit)           HPI: [name] is a 43 y.o. year old caucasian, right handed male patient seen here as in a referral on Dr Redmond Baseman, MD. Had seen ENT one year ago to qualify for Inspire device and had it implanted on 8-17 -2020 . His only sleep study took place in [place] and was a HST in 06/2017.  He  has a past medical history of History of kidney stones and Sleep apnea. He used CPAP for treatment of OSA , but never toloerated it well. He only averaged 5 hours of sleep.    The patient had the first sleep study in the year 2018 and results were not made available.  Sleep relevant medical history: Inspire implant .Family medical /sleep history: No other family member on CPAP with OSA, insomnia, or sleep walking.    Social history:  Patient is working from home, in Engineer, production- lives in a household with 5 persons/ alone. Family status is married with 3 children,.  Tobacco use: none. ETOH use ; socially. Caffeine intake in form of Coffee( 2-3 mugs) Soda( none) Tea (iced tea in restaurants) or energy drinks.    Sleep habits are as follows: dinner time is between 6-7 PM. The patient goes to bed at 11 PM and continues to sleep but reports sleep fragmentation, no bathroom breaks.  The preferred sleep position is supine with support of 1 pillow. Dreams are rare.Marland Kitchen  5.30 AM is the usual rise time. The patient wakes up with an alarm.  He reports not feeling refreshed or restored in AM, without symptoms such as dry mouth, morning headaches Naps are taken infrequently.   Review of Systems: Out of a complete 14 system review, the patient complains of only the following symptoms, and all other reviewed systems are negative.:  Fatigue, sleepy , snoring,     How likely are you to doze in the following situations: 0 = not likely, 1 = slight chance, 2 = moderate chance, 3 = high chance   Sitting and Reading? 2 Watching Television? 2 Sitting inactive in a public place (theater or meeting)? 2 As a passenger in a car for an hour without a break? 2 Lying down in the afternoon when circumstances permit?1 Sitting and talking to someone? 0 Sitting quietly after lunch without alcohol?1 In a car, while stopped for a few minutes in traffic?0   Total 10/ 24 points    Social History   Socioeconomic History  . Marital status: Married    Spouse name: Not on file  . Number of children: Not on file  . Years of education: Not on file  . Highest education level: Not on file  Occupational History  . Not on file  Social Needs  . Financial resource strain: Not on file  . Food insecurity    Worry: Not on file    Inability: Not on file  . Transportation needs    Medical: Not on file    Non-medical: Not on file  Tobacco Use  . Smoking status: Never Smoker  . Smokeless tobacco: Never Used  Substance and Sexual Activity  . Alcohol use: Yes  Frequency: Never    Comment: occcas  . Drug use: Never  . Sexual activity: Not on file  Lifestyle  . Physical activity    Days per week: Not on file    Minutes per session: Not on file  . Stress: Not on file  Relationships  . Social Herbalist on phone: Not on file    Gets together: Not on file    Attends religious service: Not on file    Active member of club or organization: Not on file    Attends meetings of clubs or organizations: Not on file    Relationship status: Not on file  Other Topics Concern  . Not on file  Social History Narrative  . Not on file     Past Medical History:  Diagnosis Date  . History of kidney stones   . Sleep apnea    uses cpap    Past Surgical History:  Procedure Laterality Date  . DRUG INDUCED ENDOSCOPY N/A 01/10/2019   Procedure: DRUG INDUCED  ENDOSCOPY;  Surgeon: Melida Quitter, MD;  Location: [place];  Service: ENT;  Laterality: N/A;  . HAND TENDON SURGERY    . IMPLANTATION OF HYPOGLOSSAL NERVE STIMULATOR  03/07/2019   IMPLANTATION OF HYPOGLOSSAL NERVE STIMULATOR (N/  . IMPLANTATION OF HYPOGLOSSAL NERVE STIMULATOR N/A 03/07/2019   Procedure: IMPLANTATION OF HYPOGLOSSAL NERVE STIMULATOR;  Surgeon: Melida Quitter, MD;  Location: [place];  Service: ENT;  Laterality: N/A;  right neck, right anterior upper chest, right lower lateral chest/upperabdomen  . WISDOM TOOTH EXTRACTION       Current Outpatient Medications on File Prior to Visit  Medication Sig Dispense Refill  . acetaminophen (TYLENOL) 500 MG tablet Take 1,000 mg by mouth every 6 (six) hours as needed for moderate pain or headache.    . Multiple Vitamins-Minerals (MULTIVITAMIN ADULTS PO) Take 1 tablet by mouth daily.     . Omega-3 Fatty Acids (FISH OIL PO) Take 1 capsule by mouth 4 (four) times a week.     No current facility-administered medications on file prior to visit.     No Known Allergies  There were no vitals filed for this visit.  Wt Readings from Last 3 Encounters:  03/07/19 180 lb (81.6 kg)  01/10/19 186 lb 11.7 oz (84.7 kg)       There is no height or weight on file to calculate BMI.  Observation:    Holds breath for 13 seconds.    General: The patient is awake, alert and appears not in acute distress. The patient is well groomed. Head: Normocephalic, atraumatic. Neck is supple.  Mallampati 3-4,  neck circumference is 16.5 inches . Nasal airflow  patent ,  Retrognathia is not seen.  Dental status: intact. Cardiovascular:  Regular rate and cardiac rhythm by pulse.   Neurologic exam : The patient is awake and alert, oriented to place and time.   Memory subjective described as intact.  Attention span & concentration ability appears normal.  Speech is fluent,  without  dysarthria, dysphonia or aphasia.  Mood and affect are  appropriate.   Cranial nerves:  There was no loss of smell or taste reported  Pupils are equal and briskly reactive to light.   Extraocular movements in vertical and horizontal planes were intact Hearing was intact to soft voice and finger rubbing.    Facial motor strength is symmetric and tongue and uvula move midline.  Motor exam:  Symmetric bulk and ROM.  Sensory: reported as normal.  Coordination: Rapid alternating movements in the fingers/hands were of normal speed. without evidence of ataxia, dysmetria or tremor.      After spending a total time of  18  minutes non-face to face and additional time for note review.   My Plan is to proceed with:  1) Inpire titration attended in lab study- patient will hopefully be titrated to optimum voltage / stimulus - goal is to identify that points and revert patient to a comfortable voltage at the time of morning discharge- the goal is to be slowly and comfortably  titrated to over the coming weeks.    I would like to thank Dr Redmond Baseman for allowing me to meet with and to take care of this pleasant patient. He will attend a in lab study in the next 14 days.  I plan to follow up either personally or through our NP within 2-3 month.    [name], MD 03-23-2019  [name] Sleep Board certified by The [employer] of Sleep Medicine and Diplomate of the [employer] of Sleep Medicine. Board certified In Neurology through the [place], Fellow of the [employer] of Neurology. Medical Director of [employer].

## 2019-04-06 ENCOUNTER — Ambulatory Visit (INDEPENDENT_AMBULATORY_CARE_PROVIDER_SITE_OTHER): Payer: Managed Care, Other (non HMO) | Admitting: Neurology

## 2019-04-06 ENCOUNTER — Other Ambulatory Visit: Payer: Self-pay

## 2019-04-06 DIAGNOSIS — G4733 Obstructive sleep apnea (adult) (pediatric): Secondary | ICD-10-CM | POA: Diagnosis not present

## 2019-04-06 DIAGNOSIS — Z789 Other specified health status: Secondary | ICD-10-CM

## 2019-04-06 DIAGNOSIS — G4719 Other hypersomnia: Secondary | ICD-10-CM

## 2019-04-07 NOTE — Progress Notes (Signed)
I had the pleasure to establish care with this patient by phone/ video visit in the past, now seeing him face to face  for inspire activation in the sleep lab.  [name]  Provider:   Asencion Partridge Mckenzie Bove,M.D . Referring Provider: Primary Care Physician:  Jenkins Rouge, DO   HPI:  Eddie Edwards is a 43 y.o. male here for follow-up post INSPIRE IMPLANTATION on 03-07-2019 through Dr. Melida Quitter, MD ENT.   Mr. [name] house had the last adjustment to his inspire device on 07 March 2019 which was a day of his implantation.  He is now seen 1 month later for a titration and device interrogation.  Stimulation settings by amplitude four 0.6 V, patient control allows for voltage between 0.6 and 1.61.  Pulse width and microseconds was 90 before and after, start delay was 30 minutes initially and has now been reduced to 20 minutes, pulse time has remained at 50 minutes, therapy duration is set by the device at 8 hours.  Therefore 3 functions of stimulation have been retitrated.  All functions have been interrogated.  Sensor settings for exhalation have remained at -4/-1. Inhalation setting at  0/+1, off period for 38% over 13%.  Maximum Stimulation Time in Seconds is 4-second duration without inversion.   Threshold Settings for sensation was 0.5 V and functional threshold was 0.8 V with a visible tongue protrusion resulting from stimulation.    Four stimulations were performed and 1  waveform was saved to the Media Tab.  The patient reported normal wound healing and no discomfort.    Review of Systems: Out of a complete 14 system review, the patient complains of only the following symptoms, and all other reviewed systems are negative.  Control of OSA with inspire device in a patient with CPAP intolerance.   Past Medical History:  Diagnosis Date  . History of kidney stones   . Sleep apnea    uses cpap    Past Surgical History:  Procedure Laterality Date  . DRUG  INDUCED ENDOSCOPY N/A 01/10/2019   Procedure: DRUG INDUCED ENDOSCOPY;  Surgeon: Melida Quitter, MD;  Location: [place];  Service: ENT;  Laterality: N/A;  . HAND TENDON SURGERY    . IMPLANTATION OF HYPOGLOSSAL NERVE STIMULATOR  03/07/2019   IMPLANTATION OF HYPOGLOSSAL NERVE STIMULATOR (N/  . IMPLANTATION OF HYPOGLOSSAL NERVE STIMULATOR N/A 03/07/2019   Procedure: IMPLANTATION OF HYPOGLOSSAL NERVE STIMULATOR;  Surgeon: Melida Quitter, MD;  Location: [place];  Service: ENT;  Laterality: N/A;  right neck, right anterior upper chest, right lower lateral chest/upperabdomen  . WISDOM TOOTH EXTRACTION      Current Outpatient Medications  Medication Sig Dispense Refill  . acetaminophen (TYLENOL) 500 MG tablet Take 1,000 mg by mouth every 6 (six) hours as needed for moderate pain or headache.    . Multiple Vitamins-Minerals (MULTIVITAMIN ADULTS PO) Take 1 tablet by mouth daily.     . Omega-3 Fatty Acids (FISH OIL PO) Take 1 capsule by mouth 4 (four) times a week.     No current facility-administered medications for this visit.     Allergies as of 04/06/2019  . (No Known Allergies)    Vitals: There were no vitals taken for this visit. Last Weight:  Wt Readings from Last 1 Encounters:  03/07/19 180 lb (81.6 kg)   Last Height:   Ht Readings from Last 1 Encounters:  03/07/19 5' 11"  (1.803 m)   The patient reported normal wound healing and no discomfort.  functional threshold was 0.8 V with a visible tongue protrusion Resulting from Stimulation.    Four stimulations were performed and 1  waveform was saved to the Media Tab.  The patient reported normal wound healing and no discomfort.     [name], MD

## 2019-05-05 ENCOUNTER — Telehealth: Payer: Self-pay

## 2019-05-05 NOTE — Telephone Encounter (Signed)
Called patient to see how he is doing with Inspire. He said it was going great. He is at a level 6 and will continue to increase gradually. He had no complaints or concerns. I confirmed his appointment for December 21st

## 2019-07-11 ENCOUNTER — Ambulatory Visit (INDEPENDENT_AMBULATORY_CARE_PROVIDER_SITE_OTHER): Payer: Managed Care, Other (non HMO) | Admitting: Neurology

## 2019-07-11 DIAGNOSIS — G4733 Obstructive sleep apnea (adult) (pediatric): Secondary | ICD-10-CM

## 2019-07-11 DIAGNOSIS — Z789 Other specified health status: Secondary | ICD-10-CM

## 2019-07-11 DIAGNOSIS — G4719 Other hypersomnia: Secondary | ICD-10-CM

## 2019-07-18 ENCOUNTER — Encounter: Payer: Self-pay | Admitting: Neurology

## 2019-07-18 ENCOUNTER — Other Ambulatory Visit: Payer: Self-pay | Admitting: Neurology

## 2019-07-18 DIAGNOSIS — G4733 Obstructive sleep apnea (adult) (pediatric): Secondary | ICD-10-CM

## 2019-07-18 DIAGNOSIS — Z789 Other specified health status: Secondary | ICD-10-CM

## 2019-07-18 DIAGNOSIS — G4719 Other hypersomnia: Secondary | ICD-10-CM

## 2019-07-18 NOTE — Procedures (Signed)
PATIENT'S NAME:  Eddie Edwards, Eddie Edwards DOB:      1975-08-06      MR#:    [id number]     DATE OF RECORDING: 07/11/2019 REFERRING M.D.:  Melida Quitter, MD, ENT Study Performed:   INSPIRE Device Titration HISTORY:  This 43 year- old male patient, previously seen in a virtual visit, is here after post [place] on 03-07-2019 through Dr. Melida Quitter, MD ENT.    Mr. [name] house had the last adjustment to his inspire device on 07 March 2019 which was a day of his implantation.  He was then seen for a titration and device interrogation, and now for end titration.   Stimulation settings by amplitude four 0.6 V, patient control allows for voltage between 0.6 and 1.61.  Pulse width and microseconds was 90 before and after, start delay was 30 minutes initially and has now been reduced to 20 minutes, pulse time has remained at 50 minutes, therapy duration is set by the device at 8 hours.  Therefore 3 functions of stimulation have been re-titrated.  All functions have been interrogated.  Sensor settings for exhalation have remained at -4/-1. Inhalation setting at 0/+1, off period for 38% over 13%. Maximum Stimulation Time in Seconds is 4-second duration without inversion.   Threshold Settings for sensation was 0.5 V and functional threshold was 0.8 V with a visible tongue protrusion resulting from stimulation. Four stimulations were performed, and 1 waveform was saved to the Media Tab. The patient reported normal wound healing and no discomfort.   The patient's weight 181 pounds with a height of 71 (inches), resulting in a BMI of 25.3 kg/m2. The patient's neck circumference measured 15.5 inches.  CURRENT MEDICATIONS: Tylenol   PROCEDURE:  This is a multichannel digital polysomnogram utilizing the SomnoStar 11.2 system.  Electrodes and sensors were applied and monitored per AASM Specifications.   EEG, EOG, Chin and Limb EMG, were sampled at 200 Hz.  ECG, Snore and Nasal Pressure, Thermal Airflow,  Respiratory Effort, CPAP Flow and Pressure, Oximetry was sampled at 50 Hz. Digital video and audio were recorded.      Inspire was first observed at 0.2 Volt for 22 minutes of sleep time. Inspire device was then initiated at 0.9 mV and changed to 0.7 mV for a total time of 33 minutes of which only 11 minutes spent sleep.  The AHI became already 0 at this setting and the oxygen nadir rose to 95%.  The technologist further titrated to one 4 mV for 7 minutes of sleep time 21.2 mV from there to 1.79m and 1.5 mV, and finally 1.7 mV under which the patient slept for a health minute only.  AHI was 0 at that setting- no REM sleep occurred.  Lights Out was at 22:07 and Lights On at 05:07. Total recording time (TRT) was 346 minutes, with a total sleep time (TST) of 108.5 minutes. The patient's sleep latency was 283 minutes. REM latency was 298 minutes.  The sleep efficiency was extremely poor at 31.4 %.    SLEEP ARCHITECTURE: WASO (Wake after sleep onset) was 244 minutes.  There were 53.5 minutes in Stage N1, 44 minutes Stage N2, 18 minutes Stage N3 and 11.5 minutes in Stage REM.  The percentage of Stage N1 was 42.1%, Stage N2 was 34.6%, Stage N3 was 14.2% and Stage R (REM sleep) was 9.1%.  The arousals were noted as: 70 were spontaneous, 0 were associated with PLMs, 29 were associated with respiratory events.  RESPIRATORY ANALYSIS:  There  was a total of 24 respiratory events: 9 obstructive apneas, 4 central apneas and 2 mixed apneas with a total of 15 apneas and an apnea index (AI) of 8.3 /hour. There were 9 hypopneas with a hypopnea index of 5./hour.  The total APNEA/HYPOPNEA INDEX (AHI) was 13.3 /hour. 3 events occurred in REM sleep and 21 events in NREM. The REM AHI was 15.7 /hour versus a non-REM AHI of 13. /hour. The patient spent 65 minutes of total sleep time in the supine position and 62 minutes in non-supine. The supine AHI was 23.2/h, versus a non-supine AHI of 5.8.  OXYGEN SATURATION & C02:  The  baseline 02 saturation was 0%, with the lowest being 84%. Time spent below 89% saturation equaled 1 minutes. The patient had a total of 0 Periodic Limb Movements.  Audio and video analysis did not show any abnormal or unusual movements, behaviors, phonations or vocalizations.  EKG was in keeping with normal sinus rhythm (NSR) and isolated PVCs.Marland Kitchen  DIAGNOSIS 1. Obstructive Sleep Apnea was controlled in non -supine sleep at 0.7 mV inspire setting but for supine sleep there was no optimal setting yet found.   2. PVCs in the EKG noted.   PLANS/RECOMMENDATIONS: 1. Every apnea patient should avoid sedatives, hypnotics, and alcohol consumption before bedtime. This patient will sleep better if supine sleep position is avoided.    DISCUSSION: return or full titration., I will offer the patient a sleep aid to allow higher sleep efficiency.    A follow up appointment will be scheduled in the Sleep Clinic at [facility].   Please call [phone] with any questions.      I certify that I have reviewed the entire raw data recording prior to the issuance of this report in accordance with the Standards of Accreditation of the American Academy of Sleep Medicine (AASM [name], M.D. Diplomat, Tax adviser of Psychiatry and Neurology  Diplomat, Tax adviser of Sleep Medicine Market researcher, [employer] [facility] at [employer]

## 2019-07-18 NOTE — Progress Notes (Signed)
Inspire titration- an optimal Mv setting was not reached.

## 2019-07-18 NOTE — Progress Notes (Signed)
PATIENT'S NAME:  Eddie Edwards, Eddie Edwards DOB:      1976-06-02      MR#:    [id number]     DATE OF RECORDING: 07/11/2019 REFERRING M.D.:  Melida Quitter, MD, ENT Study Performed:   INSPIRE Device Titration HISTORY:  This 43 year- old male patient, previously seen in a virtual visit, is here after post [place] on 03-07-2019 through Dr. Melida Quitter, MD ENT.    Mr. Eddie Edwards house had the last adjustment to his inspire device on 07 March 2019 which was a day of his implantation.  He was then seen for a titration and device interrogation, and now for end titration.   Stimulation settings by amplitude four 0.6 V, patient control allows for voltage between 0.6 and 1.61.  Pulse width and microseconds was 90 before and after, start delay was 30 minutes initially and has now been reduced to 20 minutes, pulse time has remained at 50 minutes, therapy duration is set by the device at 8 hours.  Therefore 3 functions of stimulation have been re-titrated.  All functions have been interrogated.  Sensor settings for exhalation have remained at -4/-1. Inhalation setting at 0/+1, off period for 38% over 13%. Maximum Stimulation Time in Seconds is 4-second duration without inversion.   Threshold Settings for sensation was 0.5 V and functional threshold was 0.8 V with a visible tongue protrusion resulting from stimulation. Four stimulations were performed, and 1 waveform was saved to the Media Tab. The patient reported normal wound healing and no discomfort.   The patient's weight 181 pounds with a height of 71 (inches), resulting in a BMI of 25.3 kg/m2. The patient's neck circumference measured 15.5 inches.  CURRENT MEDICATIONS: Tylenol   PROCEDURE:  This is a multichannel digital polysomnogram utilizing the SomnoStar 11.2 system.  Electrodes and sensors were applied and monitored per AASM Specifications.   EEG, EOG, Chin and Limb EMG, were sampled at 200 Hz.  ECG, Snore and Nasal Pressure, Thermal Airflow,  Respiratory Effort, CPAP Flow and Pressure, Oximetry was sampled at 50 Hz. Digital video and audio were recorded.      Inspire was first observed at 0.2 Volt for 22 minutes of sleep time. Inspire device was then initiated at 0.9 mV and changed to 0.7 mV for a total time of 33 minutes of which only 11 minutes spent sleep.  The AHI became already 0 at this setting and the oxygen nadir rose to 95%.  The technologist further titrated to one 4 mV for 7 minutes of sleep time 21.2 mV from there to 1.84m and 1.5 mV, and finally 1.7 mV under which the patient slept for a health minute only.  AHI was 0 at that setting- no REM sleep occurred.  Lights Out was at 22:07 and Lights On at 05:07. Total recording time (TRT) was 346 minutes, with a total sleep time (TST) of 108.5 minutes. The patient's sleep latency was 283 minutes. REM latency was 298 minutes.  The sleep efficiency was extremely poor at 31.4 %.    SLEEP ARCHITECTURE: WASO (Wake after sleep onset) was 244 minutes.  There were 53.5 minutes in Stage N1, 44 minutes Stage N2, 18 minutes Stage N3 and 11.5 minutes in Stage REM.  The percentage of Stage N1 was 42.1%, Stage N2 was 34.6%, Stage N3 was 14.2% and Stage R (REM sleep) was 9.1%.  The arousals were noted as: 70 were spontaneous, 0 were associated with PLMs, 29 were associated with respiratory events.  RESPIRATORY ANALYSIS:  There  was a total of 24 respiratory events: 9 obstructive apneas, 4 central apneas and 2 mixed apneas with a total of 15 apneas and an apnea index (AI) of 8.3 /hour. There were 9 hypopneas with a hypopnea index of 5./hour.  The total APNEA/HYPOPNEA INDEX (AHI) was 13.3 /hour. 3 events occurred in REM sleep and 21 events in NREM. The REM AHI was 15.7 /hour versus a non-REM AHI of 13. /hour. The patient spent 65 minutes of total sleep time in the supine position and 62 minutes in non-supine. The supine AHI was 23.2/h, versus a non-supine AHI of 5.8.  OXYGEN SATURATION & C02:  The  baseline 02 saturation was 0%, with the lowest being 84%. Time spent below 89% saturation equaled 1 minutes. The patient had a total of 0 Periodic Limb Movements.  Audio and video analysis did not show any abnormal or unusual movements, behaviors, phonations or vocalizations.  EKG was in keeping with normal sinus rhythm (NSR) and isolated PVCs.Marland Kitchen  DIAGNOSIS 1. Obstructive Sleep Apnea was controlled in non -supine sleep at 0.7 mV inspire setting but for supine sleep there was no optimal setting yet found.   2. PVCs in the EKG noted.   PLANS/RECOMMENDATIONS: 1. Every apnea patient should avoid sedatives, hypnotics, and alcohol consumption before bedtime. This patient will sleep better if supine sleep position is avoided.    DISCUSSION: return or full titration., I will offer the patient a sleep aid to allow higher sleep efficiency.    A follow up appointment will be scheduled in the Sleep Clinic at [facility].   Please call [phone] with any questions.      I certify that I have reviewed the entire raw data recording prior to the issuance of this report in accordance with the Standards of Accreditation of the American Academy of Sleep Medicine (AASM [name], M.D. Diplomat, Tax adviser of Psychiatry and Neurology  Diplomat, Tax adviser of Sleep Medicine Market researcher, [employer] [facility] at [employer]

## 2019-07-19 ENCOUNTER — Telehealth: Payer: Self-pay | Admitting: Neurology

## 2019-07-19 ENCOUNTER — Encounter: Payer: Self-pay | Admitting: Neurology

## 2019-07-19 NOTE — Telephone Encounter (Signed)
Called the patient to review the inspire titration results. There was no answer. LVM informing the patient to call back so that we can get him on the books for a scheduled apt. We will have to work him in. Advised the patient to call and I will get scheduled.

## 2019-07-19 NOTE — Telephone Encounter (Signed)
If patient calls back please advise the apt time we would like to see if it would work for him jan 14,2021 at 12 with 11:30/11:45 am check in.

## 2019-07-19 NOTE — Telephone Encounter (Signed)
-----   Message from [name], MD sent at 07/18/2019  5:18 PM EST ----- This was an inspire titration- not PAP titration,

## 2019-07-20 NOTE — Telephone Encounter (Signed)
Called the patient to discuss sleep study and also get scheduled. This was 2nd attempt. LVM asking the patient to call back so that we can review his study and also asked if Jan 14,2021 12 pm apt time would work. Asked for a return call.

## 2019-08-04 ENCOUNTER — Ambulatory Visit (INDEPENDENT_AMBULATORY_CARE_PROVIDER_SITE_OTHER): Payer: Managed Care, Other (non HMO) | Admitting: Neurology

## 2019-08-04 ENCOUNTER — Other Ambulatory Visit: Payer: Self-pay

## 2019-08-04 ENCOUNTER — Encounter: Payer: Self-pay | Admitting: Neurology

## 2019-08-04 DIAGNOSIS — G4719 Other hypersomnia: Secondary | ICD-10-CM | POA: Diagnosis not present

## 2019-08-04 DIAGNOSIS — G4733 Obstructive sleep apnea (adult) (pediatric): Secondary | ICD-10-CM | POA: Diagnosis not present

## 2019-08-04 DIAGNOSIS — Z462 Encounter for fitting and adjustment of other devices related to nervous system and special senses: Secondary | ICD-10-CM

## 2019-08-04 DIAGNOSIS — Z789 Other specified health status: Secondary | ICD-10-CM | POA: Diagnosis not present

## 2019-08-04 NOTE — Progress Notes (Signed)
I had the pleasure to establish care with this patient by phone/ video visit in the past, now seeing him face to face  for inspire activation in the sleep lab.  [name]  Provider:   Asencion Partridge Brendalee Matthies,M.D . Referring Provider: Primary Care Physician:  Melida Quitter, MD ENT    HPI:  Eddie Edwards is a 44 y.o. male here for follow-up post INSPIRE IMPLANTATION on 03-07-2019 through Dr. Melida Quitter, MD ENT.   [name] had an adjustment to his inspire device on 07 March 2019 which was a day of his implantation. He is now seen 1 month later for a titration and device interrogation.  Stimulation settings by amplitude four 0.6 V, patient control allows for voltage between 0.6 and 1.61.  Pulse width and microseconds was 90 before and after, start delay was 30 minutes initially and has now been reduced to 20 minutes, pulse time has remained at 50 minutes, therapy duration is set by the device at 8 hours.  Therefore 3 functions of stimulation have been retitrated.  All functions have been interrogated. Sensor settings for exhalation have remained at -4/-1. Inhalation setting at  0/+1, off period for 38% over 13%.  Maximum Stimulation Time in Seconds is 4-second duration without inversion.   Threshold Settings for sensation was 0.5 V and functional threshold was 0.8 V with a visible tongue protrusion resulting from stimulation.  Four stimulations were performed and 1  waveform was saved to the Media Tab. The patient reported normal wound healing and no discomfort.   Revisit: post titration from 07-11-2019, patient has had poor sleep.  In Lab titration to inspire: obstructive sleep apnea was controlled in non supine sleep only at 0.28m but snoring remained uncontrolled. He has not reached an optimal level of INSPIRE stimlulation.  His therapeutic threshold was 1.2 mV/ He is now using it 48 h a week and at goal currently 1.2 mV -but came today into 1.339m  He feels OK - he  believes it working. His sleep is better .   Meeting here to interrogate and reset. Function is now Level 3 at 1.77m55mwith a 5 MV range - AHI was zero.  Wife confirmed no longer snoring .   Review of Systems: Out of a complete 14 system review, the patient complains of only the following symptoms, and all other reviewed systems are negative.  Control of OSA with inspire device in a patient with CPAP intolerance.   How likely are you to doze in the following situations: 0 = not likely, 1 = slight chance, 2 = moderate chance, 3 = high chance  Sitting and Reading?1 Watching Television? 1 Sitting inactive in a public place (theater or meeting)?1 Lying down in the afternoon when circumstances permit?1 Sitting and talking to someone?0 Sitting quietly after lunch without alcohol?1 In a car, while stopped for a few minutes in traffic? As a passenger in a car for an hour without a break?/may be   Total = 5/ 24     Past Medical History:  Diagnosis Date  . History of kidney stones   . Sleep apnea    uses cpap    Past Surgical History:  Procedure Laterality Date  . DRUG INDUCED ENDOSCOPY N/A 01/10/2019   Procedure: DRUG INDUCED ENDOSCOPY;  Surgeon: BatMelida QuitterD;  [facility];  Laterality: N/A;  . HAND TENDON SURGERY    . IMPLANTATION OF HYPOGLOSSAL NERVE STIMULATOR  03/07/2019   IMPLANTATION OF  HYPOGLOSSAL NERVE STIMULATOR (N/  . IMPLANTATION OF HYPOGLOSSAL NERVE STIMULATOR N/A 03/07/2019   Procedure: IMPLANTATION OF HYPOGLOSSAL NERVE STIMULATOR;  Surgeon: Melida Quitter, MD;  Location: [place];  Service: ENT;  Laterality: N/A;  right neck, right anterior upper chest, right lower lateral chest/upperabdomen  . WISDOM TOOTH EXTRACTION      Current Outpatient Medications  Medication Sig Dispense Refill  . acetaminophen (TYLENOL) 500 MG tablet Take 1,000 mg by mouth every 6 (six) hours as needed for moderate pain or headache.    . Multiple  Vitamins-Minerals (MULTIVITAMIN ADULTS PO) Take 1 tablet by mouth daily.     . Omega-3 Fatty Acids (FISH OIL PO) Take 1 capsule by mouth 4 (four) times a week.     No current facility-administered medications for this visit.    Allergies as of 08/04/2019  . (No Known Allergies)    Vitals: There were no vitals taken for this visit. Last Weight:  Wt Readings from Last 1 Encounters:  03/07/19 180 lb (81.6 kg)   Last Height:   Ht Readings from Last 1 Encounters:  03/07/19 5' 11"  (1.803 m)   The patient reported normal wound healing and no discomfort.    Lower setting is now  1.0 V and upper1.4 V, latency( RAMP ) 15 minutes.  Level 3- can advance to level 4. Patient is looking for a closer to home follow up- near [place].  Estimated Rv would be in August 2021.   [name], MD  08-04-2019

## 2020-01-31 IMAGING — DX CHEST  1 VIEW
1 series · 1 of 1 positions shown · non-contrast
Comparison: None.

CLINICAL DATA: Postop sleep apnea

EXAM:
CHEST  1 VIEW

[chest ap]
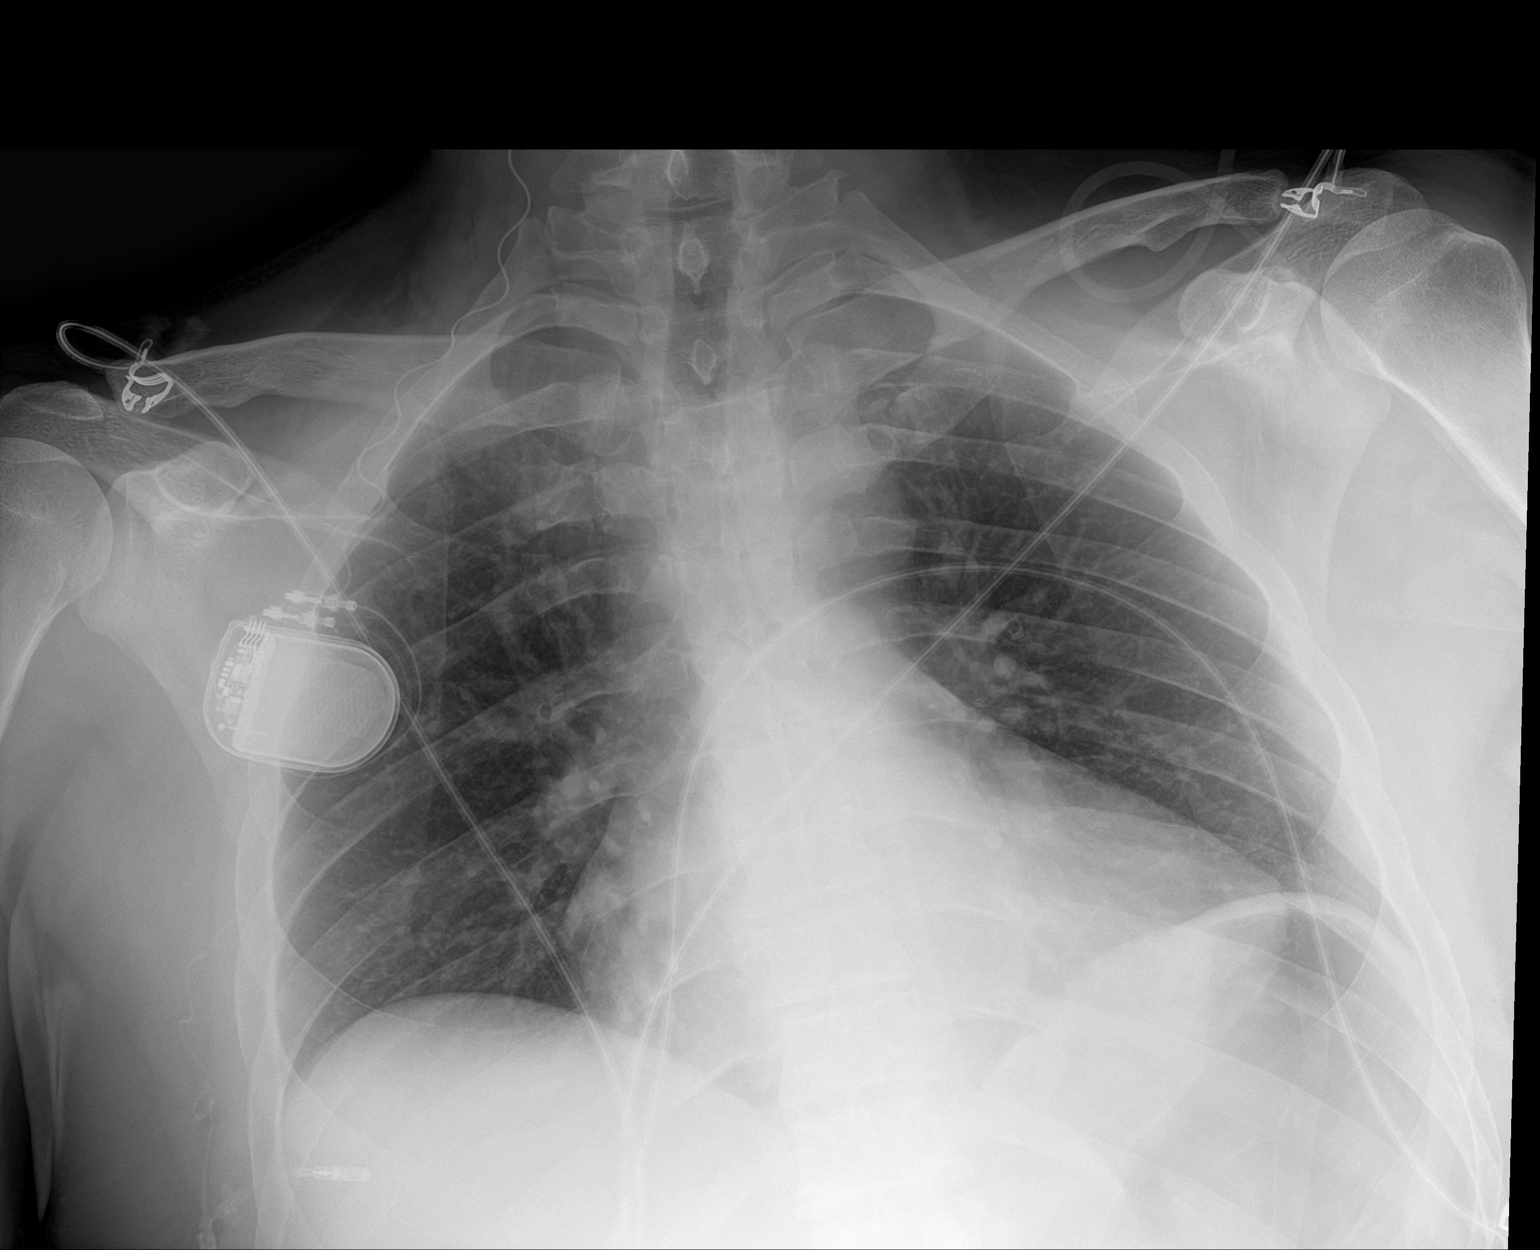

[1 of 1 positions shown; findings below may reference images not displayed]

FINDINGS: Right stimulator battery pack noted. Heart is borderline in size.
Lungs clear. No effusions or acute bony abnormality.
IMPRESSION: Borderline heart size.  No active disease.
